# Patient Record
Sex: Female | Born: 1945 | Race: Asian | Hispanic: No | Marital: Single | Smoking: Never smoker
Health system: Southern US, Community
[De-identification: ages and names within clinical notes are randomized; demographics above are authoritative.]

## PROBLEM LIST (undated history)

## (undated) DIAGNOSIS — K219 Gastro-esophageal reflux disease without esophagitis: Secondary | ICD-10-CM

## (undated) DIAGNOSIS — F259 Schizoaffective disorder, unspecified: Secondary | ICD-10-CM

## (undated) DIAGNOSIS — F319 Bipolar disorder, unspecified: Secondary | ICD-10-CM

---

## 1999-02-11 ENCOUNTER — Ambulatory Visit (HOSPITAL_COMMUNITY): Admission: RE | Admit: 1999-02-11 | Discharge: 1999-02-11 | Payer: Self-pay | Admitting: Psychiatry

## 1999-06-09 ENCOUNTER — Emergency Department (HOSPITAL_COMMUNITY): Admission: EM | Admit: 1999-06-09 | Discharge: 1999-06-09 | Payer: Self-pay | Admitting: Emergency Medicine

## 1999-09-23 ENCOUNTER — Encounter: Payer: Self-pay | Admitting: Emergency Medicine

## 1999-09-23 ENCOUNTER — Encounter: Admission: RE | Admit: 1999-09-23 | Discharge: 1999-09-23 | Payer: Self-pay | Admitting: Emergency Medicine

## 2000-11-28 ENCOUNTER — Inpatient Hospital Stay (HOSPITAL_COMMUNITY): Admission: EM | Admit: 2000-11-28 | Discharge: 2000-12-10 | Payer: Self-pay | Admitting: Psychiatry

## 2000-11-30 ENCOUNTER — Encounter: Payer: Self-pay | Admitting: Psychiatry

## 2002-09-11 ENCOUNTER — Encounter: Admission: RE | Admit: 2002-09-11 | Discharge: 2002-09-11 | Payer: Self-pay | Admitting: Emergency Medicine

## 2002-09-11 ENCOUNTER — Encounter: Payer: Self-pay | Admitting: Emergency Medicine

## 2006-02-06 ENCOUNTER — Inpatient Hospital Stay (HOSPITAL_COMMUNITY): Admission: EM | Admit: 2006-02-06 | Discharge: 2006-02-27 | Payer: Self-pay | Admitting: Psychiatry

## 2006-02-06 ENCOUNTER — Ambulatory Visit: Payer: Self-pay | Admitting: Psychiatry

## 2011-02-03 ENCOUNTER — Emergency Department (HOSPITAL_COMMUNITY)
Admission: EM | Admit: 2011-02-03 | Discharge: 2011-02-03 | Disposition: A | Discharge status: Home / Self Care | Payer: Medicare Other | Attending: Emergency Medicine | Admitting: Emergency Medicine

## 2011-02-03 DIAGNOSIS — M25519 Pain in unspecified shoulder: Secondary | ICD-10-CM | POA: Insufficient documentation

## 2011-02-03 DIAGNOSIS — Y9210 Unspecified residential institution as the place of occurrence of the external cause: Secondary | ICD-10-CM | POA: Insufficient documentation

## 2011-02-03 DIAGNOSIS — S42009A Fracture of unspecified part of unspecified clavicle, initial encounter for closed fracture: Secondary | ICD-10-CM | POA: Insufficient documentation

## 2011-02-03 DIAGNOSIS — F209 Schizophrenia, unspecified: Secondary | ICD-10-CM | POA: Insufficient documentation

## 2011-02-03 DIAGNOSIS — W010XXA Fall on same level from slipping, tripping and stumbling without subsequent striking against object, initial encounter: Secondary | ICD-10-CM | POA: Insufficient documentation

## 2013-03-26 ENCOUNTER — Emergency Department (HOSPITAL_COMMUNITY)
Admission: EM | Admit: 2013-03-26 | Discharge: 2013-03-26 | Disposition: A | Discharge status: Home / Self Care | Payer: Medicare Other | Attending: Emergency Medicine | Admitting: Emergency Medicine

## 2013-03-26 ENCOUNTER — Encounter (HOSPITAL_COMMUNITY): Payer: Self-pay | Admitting: Emergency Medicine

## 2013-03-26 DIAGNOSIS — M25561 Pain in right knee: Secondary | ICD-10-CM

## 2013-03-26 DIAGNOSIS — F319 Bipolar disorder, unspecified: Secondary | ICD-10-CM | POA: Insufficient documentation

## 2013-03-26 DIAGNOSIS — M25569 Pain in unspecified knee: Secondary | ICD-10-CM | POA: Insufficient documentation

## 2013-03-26 DIAGNOSIS — K219 Gastro-esophageal reflux disease without esophagitis: Secondary | ICD-10-CM | POA: Insufficient documentation

## 2013-03-26 DIAGNOSIS — F259 Schizoaffective disorder, unspecified: Secondary | ICD-10-CM | POA: Insufficient documentation

## 2013-03-26 HISTORY — DX: Bipolar disorder, unspecified: F31.9

## 2013-03-26 HISTORY — DX: Gastro-esophageal reflux disease without esophagitis: K21.9

## 2013-03-26 HISTORY — DX: Schizoaffective disorder, unspecified: F25.9

## 2013-03-26 MED ORDER — MELOXICAM 7.5 MG PO TABS: 7.5000 mg | ORAL_TABLET | Freq: Every day | ORAL | Status: AC

## 2013-03-26 MED ORDER — IBUPROFEN 200 MG PO TABS
400.0000 mg | ORAL_TABLET | Freq: Once | ORAL | Status: AC
  Administered 2013-03-26: 400 mg via ORAL
  Filled 2013-03-26: qty 2

## 2013-03-26 NOTE — ED Provider Notes (Signed)
CSN: [id number]     Arrival date & time 03/26/13  2139 History   This chart was scribed for non-physician practitioner Kyung Bacca, PA-C working with Celene Kras, MD by Joaquin Music, ED Scribe. This patient was seen in room WTR6/WTR6 and the patient's care was started at 9:55 PM .  Chief Complaint  Patient presents with  . Knee Pain    The history is provided by the patient. No language interpreter was used.   HPI Comments: April Lopez is a 67 y.o. female who presents to the Emergency Department complaining of constant, gradually worsening R knee pain with associated swelling x 2 days.  Aggravated by ambulation. Has not taken anything for pain. No associated fever, skin changes, RLE weakness/paresthesias.  Pt states she has never had pain in the R knee but states she has had similar pain in the L knee.  Pt denies recent falls or traumas.  Immunocompetent.     Past Medical History  Diagnosis Date  . Schizoaffective disorder   . Bipolar disorder   . GERD (gastroesophageal reflux disease)    No past surgical history on file. No family history on file. History  Substance Use Topics  . Smoking status: Not on file  . Smokeless tobacco: Not on file  . Alcohol Use: Not on file   OB History   Grav Para Term Preterm Abortions TAB SAB Ect Mult Living                 Review of Systems  Musculoskeletal:       R Knee Pain   All other systems reviewed and are negative.    Allergies  Review of patient's allergies indicates no known allergies.  Home Medications   Current Outpatient Rx  Name  Route  Sig  Dispense  Refill  . aspirin 500 MG EC tablet   Oral   Take 1,000 mg by mouth every 6 (six) hours as needed for pain.         . benztropine (COGENTIN) 0.5 MG tablet   Oral   Take 0.5 mg by mouth 2 (two) times daily.         Marland Kitchen dextromethorphan-guaiFENesin (ROBITUSSIN-DM) 10-100 MG/5ML liquid   Oral   Take 5 mLs by mouth every 4 (four) hours as needed for  cough.         Marland Kitchen LORazepam (ATIVAN) 0.5 MG tablet   Oral   Take 0.5 mg by mouth at bedtime.         Marland Kitchen omeprazole (PRILOSEC) 20 MG capsule   Oral   Take 20 mg by mouth at bedtime.         . risperiDONE (RISPERDAL) 0.5 MG tablet   Oral   Take 0.5 mg by mouth 2 (two) times daily.         . risperiDONE (RISPERDAL) 2 MG tablet   Oral   Take 2 mg by mouth at bedtime.          Triage Vitals:BP 125/72  Pulse 76  Temp(Src) 98 F (36.7 C) (Oral)  Resp 16  SpO2 98%  Physical Exam  Nursing note and vitals reviewed. Constitutional: She is oriented to person, place, and time. She appears well-developed and well-nourished. No distress.  HENT:  Head: Normocephalic and atraumatic.  Eyes:  Normal appearance  Neck: Normal range of motion.  Pulmonary/Chest: Effort normal.  Musculoskeletal: Normal range of motion.  Right knee w/out deformity, erythema or other skin changes.  Trace edema medial  to patella compared to L knee.  No knee tenderness.  Pain w/ passive flexion/extension.  No edema/tenderness of calf.  2+ DP pulse and distal sensation intact.    Neurological: She is alert and oriented to person, place, and time.  Psychiatric: She has a normal mood and affect. Her behavior is normal.    ED Course  Procedures  Labs Review Labs Reviewed - No data to display Imaging Review No results found.  EKG Interpretation   None       MDM   1. Pain in right knee    [phone] F presents w/ non-traumatic R knee pain.  No signs of septic arthritis on exam.  History/exam most consistent w/ OA. Ortho tech provided pt w/ knee sleeve, I prescribed mobic, and recommended RICE.  Referred to ortho for persistent/worsening sx.  Return precautions discussed.   I personally performed the services described in this documentation, which was scribed in my presence. The recorded information has been reviewed and is accurate. s   Otilio Miu, PA-C 03/26/13 2352

## 2013-03-26 NOTE — ED Notes (Signed)
[facility] here to take pt back to [facility].

## 2013-03-26 NOTE — ED Notes (Signed)
Pt BIB EMS. Pt is from [facility].  Pt c/o R knee pain. No injury to knee. Pt states knee has been hurting for the past 2 days. Pt normally walks, but now walks with limp. Pt was not given any pain meds. Pt alert to baseline.

## 2013-03-26 NOTE — ED Notes (Signed)
[facility] called to take pt back to [facility].

## 2013-03-31 NOTE — ED Provider Notes (Signed)
Medical screening examination/treatment/procedure(s) were performed by non-physician practitioner and as supervising physician I was immediately available for consultation/collaboration.   Celene Kras, MD 03/31/13 [id number]

## 2013-08-21 ENCOUNTER — Emergency Department (HOSPITAL_COMMUNITY)
Admission: EM | Admit: 2013-08-21 | Discharge: 2013-08-21 | Disposition: A | Discharge status: Home / Self Care | Payer: Medicare Other | Attending: Emergency Medicine | Admitting: Emergency Medicine

## 2013-08-21 ENCOUNTER — Emergency Department (HOSPITAL_COMMUNITY): Payer: Medicare Other

## 2013-08-21 ENCOUNTER — Encounter (HOSPITAL_COMMUNITY): Payer: Self-pay | Admitting: Emergency Medicine

## 2013-08-21 DIAGNOSIS — R05 Cough: Secondary | ICD-10-CM

## 2013-08-21 DIAGNOSIS — K219 Gastro-esophageal reflux disease without esophagitis: Secondary | ICD-10-CM | POA: Insufficient documentation

## 2013-08-21 DIAGNOSIS — Z791 Long term (current) use of non-steroidal anti-inflammatories (NSAID): Secondary | ICD-10-CM | POA: Insufficient documentation

## 2013-08-21 DIAGNOSIS — Z79899 Other long term (current) drug therapy: Secondary | ICD-10-CM | POA: Insufficient documentation

## 2013-08-21 DIAGNOSIS — F259 Schizoaffective disorder, unspecified: Secondary | ICD-10-CM | POA: Insufficient documentation

## 2013-08-21 DIAGNOSIS — J069 Acute upper respiratory infection, unspecified: Secondary | ICD-10-CM | POA: Insufficient documentation

## 2013-08-21 DIAGNOSIS — R059 Cough, unspecified: Secondary | ICD-10-CM | POA: Insufficient documentation

## 2013-08-21 DIAGNOSIS — R0789 Other chest pain: Secondary | ICD-10-CM | POA: Insufficient documentation

## 2013-08-21 DIAGNOSIS — Z880 Allergy status to penicillin: Secondary | ICD-10-CM | POA: Insufficient documentation

## 2013-08-21 LAB — CBC WITH DIFFERENTIAL/PLATELET
BASOS ABS: 0 10*3/uL (ref 0.0–0.1)
Basophils Relative: 1 % (ref 0–1)
EOS PCT: 4 % (ref 0–5)
Eosinophils Absolute: 0.1 10*3/uL (ref 0.0–0.7)
HCT: 37.5 % (ref 36.0–46.0)
Hemoglobin: 12.9 g/dL (ref 12.0–15.0)
LYMPHS ABS: 1.3 10*3/uL (ref 0.7–4.0)
LYMPHS PCT: 40 % (ref 12–46)
MCH: 31.6 pg (ref 26.0–34.0)
MCHC: 34.4 g/dL (ref 30.0–36.0)
MCV: 91.9 fL (ref 78.0–100.0)
Monocytes Absolute: 0.4 10*3/uL (ref 0.1–1.0)
Monocytes Relative: 14 % — ABNORMAL HIGH (ref 3–12)
NEUTROS ABS: 1.3 10*3/uL — AB (ref 1.7–7.7)
Neutrophils Relative %: 41 % — ABNORMAL LOW (ref 43–77)
PLATELETS: 129 10*3/uL — AB (ref 150–400)
RBC: 4.08 MIL/uL (ref 3.87–5.11)
RDW: 12.7 % (ref 11.5–15.5)
WBC: 3.2 10*3/uL — AB (ref 4.0–10.5)

## 2013-08-21 LAB — I-STAT TROPONIN, ED: TROPONIN I, POC: 0.01 ng/mL (ref 0.00–0.08)

## 2013-08-21 LAB — I-STAT CHEM 8, ED
BUN: 8 mg/dL (ref 6–23)
CHLORIDE: 103 meq/L (ref 96–112)
Calcium, Ion: 1.24 mmol/L (ref 1.13–1.30)
Creatinine, Ser: 0.7 mg/dL (ref 0.50–1.10)
GLUCOSE: 91 mg/dL (ref 70–99)
HEMATOCRIT: 39 % (ref 36.0–46.0)
HEMOGLOBIN: 13.3 g/dL (ref 12.0–15.0)
POTASSIUM: 3.5 meq/L — AB (ref 3.7–5.3)
SODIUM: 141 meq/L (ref 137–147)
TCO2: 28 mmol/L (ref 0–100)

## 2013-08-21 MED ORDER — DM-GUAIFENESIN ER 30-600 MG PO TB12
1.0000 | ORAL_TABLET | Freq: Two times a day (BID) | ORAL | Status: DC
  Administered 2013-08-21: 1 via ORAL
  Filled 2013-08-21 (×2): qty 1

## 2013-08-21 MED ORDER — DM-GUAIFENESIN ER 30-600 MG PO TB12: 1.0000 | ORAL_TABLET | Freq: Two times a day (BID) | ORAL | Status: AC | PRN

## 2013-08-21 MED ORDER — BENZONATATE 100 MG PO CAPS
200.0000 mg | ORAL_CAPSULE | Freq: Three times a day (TID) | ORAL | Status: DC | PRN
  Administered 2013-08-21: 200 mg via ORAL
  Filled 2013-08-21: qty 2

## 2013-08-21 MED ORDER — BENZONATATE 200 MG PO CAPS: 200.0000 mg | ORAL_CAPSULE | Freq: Three times a day (TID) | ORAL | Status: AC | PRN

## 2013-08-21 NOTE — ED Notes (Signed)
Patient states she had flu x2 weeks and had throat pain and it moved into her central chest. Patient pain woke her up from sleep. Patient received 324 ASA and Nitro x2. Patient now complaining of 3/10 back pain.

## 2013-08-21 NOTE — Discharge Instructions (Signed)
Chest Pain Observation It is often hard to give a specific diagnosis for the cause of chest pain. Among other possibilities your symptoms might be caused by inadequate oxygen delivery to your heart (angina). Angina that is not treated or evaluated can lead to a heart attack (myocardial infarction) or death. Blood tests, electrocardiograms, and X-rays may have been done to help determine a possible cause of your chest pain. After evaluation and observation, your health care provider has determined that it is unlikely your pain was caused by an unstable condition that requires hospitalization. However, a full evaluation of your pain may need to be completed, with additional diagnostic testing as directed. It is very important to keep your follow-up appointments. Not keeping your follow-up appointments could result in permanent heart damage, disability, or death. If there is any problem keeping your follow-up appointments, you must call your health care provider. HOME CARE INSTRUCTIONS  Due to the slight chance that your pain could be angina, it is important to follow your health care provider's treatment plan and also maintain a healthy lifestyle:  Maintain or work toward achieving a healthy weight.  Stay physically active and exercise regularly.  Decrease your salt intake.  Eat a balanced, healthy diet. Talk to a dietician to learn about heart healthy foods.  Increase your fiber intake by including whole grains, vegetables, fruits, and nuts in your diet.  Avoid situations that cause stress, anger, or depression.  Take medicines as advised by your health care provider. Report any side effects to your health care provider. Do not stop medicines or adjust the dosages on your own.  Quit smoking. Do not use nicotine patches or gum until you check with your health care provider.  Keep your blood pressure, blood sugar, and cholesterol levels within normal limits.  Limit alcohol intake to no more than  1 drink per day for women that are not pregnant and 2 drinks per day for men.  Do not abuse drugs. SEEK IMMEDIATE MEDICAL CARE IF: You have severe chest pain or pressure which may include symptoms such as:  You feel pain or pressure in you arms, neck, jaw, or back.  You have severe back or abdominal pain, feel sick to your stomach (nauseous), or throw up (vomit).  You are sweating profusely.  You are having a fast or irregular heartbeat.  You feel short of breath while at rest.  You notice increasing shortness of breath during rest, sleep, or with activity.  You have chest pain that does not get better after rest or after taking your usual medicine.  You wake from sleep with chest pain.  You are unable to sleep because you cannot breathe.  You develop a frequent cough or you are coughing up blood.  You feel dizzy, faint, or experience extreme fatigue.  You develop severe weakness, dizziness, fainting, or chills. Any of these symptoms may represent a serious problem that is an emergency. Do not wait to see if the symptoms will go away. Call your local emergency services (911 in the [place] Do not drive yourself to the hospital. MAKE SURE YOU:  Understand these instructions.  Will watch your condition.  Will get help right away if you are not doing well or get worse. Document Released: 06/24/2010 Document Revised: 01/22/2013 Document Reviewed: 11/21/2012 [facility] Patient Information 2014 [place], [place].  Cough, Adult  A cough is a reflex that helps clear your throat and airways. It can help heal the body or may be a reaction to an  irritated airway. A cough may only last 2 or 3 weeks (acute) or may last more than 8 weeks (chronic).  CAUSES Acute cough:  Viral or bacterial infections. Chronic cough:  Infections.  Allergies.  Asthma.  Post-nasal drip.  Smoking.  Heartburn or acid reflux.  Some medicines.  Chronic lung problems (COPD).  Cancer. SYMPTOMS    Cough.  Fever.  Chest pain.  Increased breathing rate.  High-pitched whistling sound when breathing (wheezing).  Colored mucus that you cough up (sputum). TREATMENT   A bacterial cough may be treated with antibiotic medicine.  A viral cough must run its course and will not respond to antibiotics.  Your caregiver may recommend other treatments if you have a chronic cough. HOME CARE INSTRUCTIONS   Only take over-the-counter or prescription medicines for pain, discomfort, or fever as directed by your caregiver. Use cough suppressants only as directed by your caregiver.  Use a cold steam vaporizer or humidifier in your bedroom or home to help loosen secretions.  Sleep in a semi-upright position if your cough is worse at night.  Rest as needed.  Stop smoking if you smoke. SEEK IMMEDIATE MEDICAL CARE IF:   You have pus in your sputum.  Your cough starts to worsen.  You cannot control your cough with suppressants and are losing sleep.  You begin coughing up blood.  You have difficulty breathing.  You develop pain which is getting worse or is uncontrolled with medicine.  You have a fever. MAKE SURE YOU:   Understand these instructions.  Will watch your condition.  Will get help right away if you are not doing well or get worse. Document Released: 11/18/2010 Document Revised: 08/14/2011 Document Reviewed: 11/18/2010 [facility] Patient Information 2014 [place], [place].  Upper Respiratory Infection, Adult An upper respiratory infection (URI) is also sometimes known as the common cold. The upper respiratory tract includes the nose, sinuses, throat, trachea, and bronchi. Bronchi are the airways leading to the lungs. Most people improve within 1 week, but symptoms can last up to 2 weeks. A residual cough may last even longer.  CAUSES Many different viruses can infect the tissues lining the upper respiratory tract. The tissues become irritated and inflamed and often become  very moist. Mucus production is also common. A cold is contagious. You can easily spread the virus to others by oral contact. This includes kissing, sharing a glass, coughing, or sneezing. Touching your mouth or nose and then touching a surface, which is then touched by another person, can also spread the virus. SYMPTOMS  Symptoms typically develop 1 to 3 days after you come in contact with a cold virus. Symptoms vary from person to person. They may include:  Runny nose.  Sneezing.  Nasal congestion.  Sinus irritation.  Sore throat.  Loss of voice (laryngitis).  Cough.  Fatigue.  Muscle aches.  Loss of appetite.  Headache.  Low-grade fever. DIAGNOSIS  You might diagnose your own cold based on familiar symptoms, since most people get a cold 2 to 3 times a year. Your caregiver can confirm this based on your exam. Most importantly, your caregiver can check that your symptoms are not due to another disease such as strep throat, sinusitis, pneumonia, asthma, or epiglottitis. Blood tests, throat tests, and X-rays are not necessary to diagnose a common cold, but they may sometimes be helpful in excluding other more serious diseases. Your caregiver will decide if any further tests are required. RISKS AND COMPLICATIONS  You may be at risk for a  more severe case of the common cold if you smoke cigarettes, have chronic heart disease (such as heart failure) or lung disease (such as asthma), or if you have a weakened immune system. The very young and very old are also at risk for more serious infections. Bacterial sinusitis, middle ear infections, and bacterial pneumonia can complicate the common cold. The common cold can worsen asthma and chronic obstructive pulmonary disease (COPD). Sometimes, these complications can require emergency medical care and may be life-threatening. PREVENTION  The best way to protect against getting a cold is to practice good hygiene. Avoid oral or hand contact with  people with cold symptoms. Wash your hands often if contact occurs. There is no clear evidence that vitamin C, vitamin E, echinacea, or exercise reduces the chance of developing a cold. However, it is always recommended to get plenty of rest and practice good nutrition. TREATMENT  Treatment is directed at relieving symptoms. There is no cure. Antibiotics are not effective, because the infection is caused by a virus, not by bacteria. Treatment may include:  Increased fluid intake. Sports drinks offer valuable electrolytes, sugars, and fluids.  Breathing heated mist or steam (vaporizer or shower).  Eating chicken soup or other clear broths, and maintaining good nutrition.  Getting plenty of rest.  Using gargles or lozenges for comfort.  Controlling fevers with ibuprofen or acetaminophen as directed by your caregiver.  Increasing usage of your inhaler if you have asthma. Zinc gel and zinc lozenges, taken in the first 24 hours of the common cold, can shorten the duration and lessen the severity of symptoms. Pain medicines may help with fever, muscle aches, and throat pain. A variety of non-prescription medicines are available to treat congestion and runny nose. Your caregiver can make recommendations and may suggest nasal or lung inhalers for other symptoms.  HOME CARE INSTRUCTIONS   Only take over-the-counter or prescription medicines for pain, discomfort, or fever as directed by your caregiver.  Use a warm mist humidifier or inhale steam from a shower to increase air moisture. This may keep secretions moist and make it easier to breathe.  Drink enough water and fluids to keep your urine clear or pale yellow.  Rest as needed.  Return to work when your temperature has returned to normal or as your caregiver advises. You may need to stay home longer to avoid infecting others. You can also use a face mask and careful hand washing to prevent spread of the virus. SEEK MEDICAL CARE IF:   After  the first few days, you feel you are getting worse rather than better.  You need your caregiver's advice about medicines to control symptoms.  You develop chills, worsening shortness of breath, or brown or red sputum. These may be signs of pneumonia.  You develop yellow or brown nasal discharge or pain in the face, especially when you bend forward. These may be signs of sinusitis.  You develop a fever, swollen neck glands, pain with swallowing, or white areas in the back of your throat. These may be signs of strep throat. SEEK IMMEDIATE MEDICAL CARE IF:   You have a fever.  You develop severe or persistent headache, ear pain, sinus pain, or chest pain.  You develop wheezing, a prolonged cough, cough up blood, or have a change in your usual mucus (if you have chronic lung disease).  You develop sore muscles or a stiff neck. Document Released: 11/15/2000 Document Revised: 08/14/2011 Document Reviewed: 09/23/2010 [facility] Patient Information 2014 [place].

## 2013-08-21 NOTE — ED Provider Notes (Signed)
CSN: [id number]     Arrival date & time 08/21/13  0342 History   First MD Initiated Contact with Patient 08/21/13 [phone]     Chief Complaint  Patient presents with  . Code STEMI     (Consider location/radiation/quality/duration/timing/severity/associated sxs/prior Treatment) HPI 68 year old female presents to emergency department from her nursing facility with complaint of chest pain.  Patient was initially called out as a code STEMI, do to some ST elevations in V2, and 3.  Patient, reports she's had 2 weeks of the flu.  Initially started with your pain, which moved into her chest.  Pain tonight was worse than it had been.  It woke her from sleep.  After aspirin, patient reports pain is improved.  She reports pain in her chest is mainly in her upper back.  She, reports she's had some cough.  She has had subjective fevers and chills.  After evaluation by cardiology, code STEMI, was discontinued Past Medical History  Diagnosis Date  . Schizoaffective disorder   . Bipolar disorder   . GERD (gastroesophageal reflux disease)    History reviewed. No pertinent past surgical history. No family history on file. History  Substance Use Topics  . Smoking status: Never Smoker   . Smokeless tobacco: Never Used  . Alcohol Use: No   OB History   Grav Para Term Preterm Abortions TAB SAB Ect Mult Living                 Review of Systems  Unable to perform ROS: Psychiatric disorder      Allergies  Penicillins  Home Medications   Current Outpatient Rx  Name  Route  Sig  Dispense  Refill  . aspirin 500 MG EC tablet   Oral   Take 1,000 mg by mouth every 6 (six) hours as needed for pain.         . benztropine (COGENTIN) 0.5 MG tablet   Oral   Take 0.5 mg by mouth 2 (two) times daily.         Marland Kitchen dextromethorphan-guaiFENesin (ROBITUSSIN-DM) 10-100 MG/5ML liquid   Oral   Take 5 mLs by mouth every 4 (four) hours as needed for cough.         Marland Kitchen LORazepam (ATIVAN) 0.5 MG tablet   Oral   Take 0.5 mg by mouth at bedtime.         . meloxicam (MOBIC) 7.5 MG tablet   Oral   Take 1 tablet (7.5 mg total) by mouth daily.   30 tablet   0   . omeprazole (PRILOSEC) 20 MG capsule   Oral   Take 20 mg by mouth at bedtime.         . risperiDONE (RISPERDAL) 0.5 MG tablet   Oral   Take 0.5 mg by mouth 2 (two) times daily.         . risperiDONE (RISPERDAL) 2 MG tablet   Oral   Take 2 mg by mouth at bedtime.          BP 98/62  Pulse 63  Temp(Src) 97.3 F (36.3 C) (Oral)  Resp 18  SpO2 98% Physical Exam  Nursing note and vitals reviewed. Constitutional: She is oriented to person, place, and time. She appears well-developed and well-nourished. No distress.  HENT:  Head: Normocephalic and atraumatic.  Right Ear: External ear normal.  Left Ear: External ear normal.  Nose: Nose normal.  Mouth/Throat: Oropharynx is clear and moist.  Eyes: Conjunctivae and EOM are normal. Pupils are  equal, round, and reactive to light.  Neck: Normal range of motion. Neck supple. No JVD present. No tracheal deviation present. No thyromegaly present.  Cardiovascular: Normal rate, regular rhythm, normal heart sounds and intact distal pulses.  Exam reveals no gallop and no friction rub.   No murmur heard. Pulmonary/Chest: Effort normal and breath sounds normal. No stridor. No respiratory distress. She has no wheezes. She has no rales. She exhibits no tenderness.  Abdominal: Soft. Bowel sounds are normal. She exhibits no distension and no mass. There is no tenderness. There is no rebound and no guarding.  Musculoskeletal: Normal range of motion. She exhibits no edema and no tenderness.  Lymphadenopathy:    She has no cervical adenopathy.  Neurological: She is alert and oriented to person, place, and time. She has normal reflexes. No cranial nerve deficit. She exhibits normal muscle tone. Coordination normal.  Skin: Skin is warm and dry. No rash noted. No erythema. No pallor.  Psychiatric:  She has a normal mood and affect. Her behavior is normal. Judgment and thought content normal.    ED Course  Procedures (including critical care time) Labs Review Labs Reviewed  CBC WITH DIFFERENTIAL - Abnormal; Notable for the following:    WBC 3.2 (*)    Platelets 129 (*)    Neutrophils Relative % 41 (*)    Neutro Abs 1.3 (*)    Monocytes Relative 14 (*)    All other components within normal limits  I-STAT CHEM 8, ED - Abnormal; Notable for the following:    Potassium 3.5 (*)    All other components within normal limits  I-STAT TROPOININ, ED   Imaging Review Dg Chest 2 View  08/21/2013   CLINICAL DATA:  Chest pain  EXAM: CHEST  2 VIEW  COMPARISON:  None available  FINDINGS: The cardiac and mediastinal silhouettes are within normal limits.  The lungs are normally inflated. No airspace consolidation, pleural effusion, or pulmonary edema is identified. There is no pneumothorax.  No acute osseous abnormality identified.  IMPRESSION: No radiographic evidence of acute cardiopulmonary abnormality.   Electronically Signed   By: Rise MuBenjamin  McClintock M.D.   On: 08/21/2013 04:43     EKG Interpretation None      Date: 08/21/2013  Rate: 61  Rhythm: normal sinus rhythm  QRS Axis: right  Intervals: normal  ST/T Wave abnormalities: normal  Conduction Disutrbances:none  Narrative Interpretation:   Old EKG Reviewed: none available    MDM   Final diagnoses:  Cough  Atypical chest pain  URI (upper respiratory infection)    68 year old female who reports flulike symptoms for 2 weeks.  Not a code STEMI.  Plan to get chest x-ray, and labs and reassess    Olivia Mackielga M Jawuan Robb, MD 08/21/13 [phone]

## 2013-08-21 NOTE — ED Notes (Signed)
Dr. Harwani at bedside. 

## 2013-08-21 NOTE — ED Notes (Signed)
[facility] leaving with [facility]

## 2015-07-13 ENCOUNTER — Other Ambulatory Visit: Payer: Self-pay | Admitting: Family Medicine

## 2015-07-13 DIAGNOSIS — Z1231 Encounter for screening mammogram for malignant neoplasm of breast: Secondary | ICD-10-CM

## 2015-07-27 ENCOUNTER — Ambulatory Visit: Payer: Medicare Other

## 2015-07-27 ENCOUNTER — Ambulatory Visit
Admission: RE | Admit: 2015-07-27 | Discharge: 2015-07-27 | Disposition: A | Discharge status: Home / Self Care | Payer: Medicare Other | Source: Ambulatory Visit | Attending: Family Medicine | Admitting: Family Medicine

## 2015-07-27 DIAGNOSIS — Z1231 Encounter for screening mammogram for malignant neoplasm of breast: Secondary | ICD-10-CM

## 2015-07-30 ENCOUNTER — Other Ambulatory Visit: Payer: Self-pay | Admitting: Family Medicine

## 2015-07-30 DIAGNOSIS — R928 Other abnormal and inconclusive findings on diagnostic imaging of breast: Secondary | ICD-10-CM

## 2015-08-05 ENCOUNTER — Ambulatory Visit
Admission: RE | Admit: 2015-08-05 | Discharge: 2015-08-05 | Disposition: A | Discharge status: Home / Self Care | Payer: Medicare Other | Source: Ambulatory Visit | Attending: Family Medicine | Admitting: Family Medicine

## 2015-08-05 DIAGNOSIS — R928 Other abnormal and inconclusive findings on diagnostic imaging of breast: Secondary | ICD-10-CM

## 2016-07-24 ENCOUNTER — Emergency Department (HOSPITAL_COMMUNITY): Payer: Medicare Other

## 2016-07-24 ENCOUNTER — Encounter (HOSPITAL_COMMUNITY): Payer: Self-pay

## 2016-07-24 ENCOUNTER — Emergency Department (HOSPITAL_COMMUNITY)
Admission: EM | Admit: 2016-07-24 | Discharge: 2016-07-24 | Disposition: A | Discharge status: Home / Self Care | Payer: Medicare Other | Attending: Emergency Medicine | Admitting: Emergency Medicine

## 2016-07-24 DIAGNOSIS — Z79899 Other long term (current) drug therapy: Secondary | ICD-10-CM | POA: Diagnosis not present

## 2016-07-24 DIAGNOSIS — Y929 Unspecified place or not applicable: Secondary | ICD-10-CM | POA: Diagnosis not present

## 2016-07-24 DIAGNOSIS — W010XXA Fall on same level from slipping, tripping and stumbling without subsequent striking against object, initial encounter: Secondary | ICD-10-CM | POA: Diagnosis not present

## 2016-07-24 DIAGNOSIS — M25562 Pain in left knee: Secondary | ICD-10-CM

## 2016-07-24 DIAGNOSIS — Y939 Activity, unspecified: Secondary | ICD-10-CM | POA: Insufficient documentation

## 2016-07-24 DIAGNOSIS — Y999 Unspecified external cause status: Secondary | ICD-10-CM | POA: Insufficient documentation

## 2016-07-24 DIAGNOSIS — S8002XA Contusion of left knee, initial encounter: Secondary | ICD-10-CM | POA: Insufficient documentation

## 2016-07-24 DIAGNOSIS — M25552 Pain in left hip: Secondary | ICD-10-CM | POA: Diagnosis not present

## 2016-07-24 DIAGNOSIS — M25561 Pain in right knee: Secondary | ICD-10-CM

## 2016-07-24 DIAGNOSIS — S8001XA Contusion of right knee, initial encounter: Secondary | ICD-10-CM | POA: Diagnosis not present

## 2016-07-24 DIAGNOSIS — Z7982 Long term (current) use of aspirin: Secondary | ICD-10-CM | POA: Diagnosis not present

## 2016-07-24 DIAGNOSIS — W19XXXA Unspecified fall, initial encounter: Secondary | ICD-10-CM

## 2016-07-24 MED ORDER — ACETAMINOPHEN 325 MG PO TABS
650.0000 mg | ORAL_TABLET | Freq: Once | ORAL | Status: AC
  Administered 2016-07-24: 650 mg via ORAL
  Filled 2016-07-24: qty 2

## 2016-07-24 NOTE — ED Triage Notes (Signed)
Pt tripped today and landed on her knees. Both knees appear bruised, and R knee is swollen. Pt has full ROM of both knees and was able to stand in triage. No LOC, No Blood Thinners. A&Ox4.

## 2016-07-24 NOTE — ED Provider Notes (Signed)
[facility]-EMERGENCY DEPT Provider Note   CSN: [id number] Arrival date & time: 07/24/16  1702  By signing my name below, I, Sonum Patel, attest that this documentation has been prepared under the direction and in the presence of [employer], PA-C. Electronically Signed: Sonum Patel, Neurosurgeon. 07/24/16. 7:55 PM.  History   Chief Complaint Chief Complaint  Patient presents with  . Fall  . Knee Pain    R    The history is provided by the patient. No language interpreter was used.     HPI Comments:  April Lopez is a 71 y.o. female who presents to the Emergency Department complaining of a fall that occurred today. Patient states she tripped and fell, landing on both knees. She currently has bilateral knee pain, right significantly worse than left. She has associated swelling to the right knee and bruising to the left knee. She states the pain is worse with ambulation. She denies head injury, LOC, or any other body injuries at this time. She denies taking any OTC pain medications. She denies anti-coagulant use.   Past Medical History:  Diagnosis Date  . Bipolar disorder (HCC)   . GERD (gastroesophageal reflux disease)   . Schizoaffective disorder (HCC)     There are no active problems to display for this patient.   History reviewed. No pertinent surgical history.  OB History    No data available       Home Medications    Prior to Admission medications   Medication Sig Start Date End Date Taking? Authorizing Provider  aspirin 500 MG EC tablet Take 1,000 mg by mouth every 6 (six) hours as needed for pain.    Historical Provider, MD  benzonatate (TESSALON) 200 MG capsule Take 1 capsule (200 mg total) by mouth 3 (three) times daily as needed for cough. 08/21/13   Marisa Severin, MD  benztropine (COGENTIN) 0.5 MG tablet Take 0.5 mg by mouth 2 (two) times daily.    Historical Provider, MD  dextromethorphan-guaiFENesin (MUCINEX DM) 30-600 MG per 12 hr tablet Take 1 tablet by mouth 2 (two)  times daily as needed for cough (congestion). 08/21/13   Marisa Severin, MD  dextromethorphan-guaiFENesin (ROBITUSSIN-DM) 10-100 MG/5ML liquid Take 5 mLs by mouth every 4 (four) hours as needed for cough.    Historical Provider, MD  LORazepam (ATIVAN) 0.5 MG tablet Take 0.5 mg by mouth at bedtime.    Historical Provider, MD  meloxicam (MOBIC) 7.5 MG tablet Take 1 tablet (7.5 mg total) by mouth daily. 03/26/13   Santina Evans Schinlever, PA-C  omeprazole (PRILOSEC) 20 MG capsule Take 20 mg by mouth at bedtime.    Historical Provider, MD  risperiDONE (RISPERDAL) 0.5 MG tablet Take 0.5 mg by mouth 2 (two) times daily.    Historical Provider, MD  risperiDONE (RISPERDAL) 2 MG tablet Take 2 mg by mouth at bedtime.    Historical Provider, MD    Family History History reviewed. No pertinent family history.  Social History Social History  Substance Use Topics  . Smoking status: Never Smoker  . Smokeless tobacco: Never Used  . Alcohol use No     Allergies   Penicillins   Review of Systems Review of Systems  Musculoskeletal: Positive for arthralgias and joint swelling.  Skin: Positive for color change (bruising).  Neurological: Positive for headaches. Negative for weakness and numbness.     Physical Exam Updated Vital Signs BP 158/83   Pulse 101   Temp 97.9 F (36.6 C) (Oral)   Resp 16  SpO2 95%   Physical Exam  Constitutional: She is oriented to person, place, and time. She appears well-developed and well-nourished.  HENT:  Head: Normocephalic and atraumatic.  Cardiovascular: Normal rate.   Pulmonary/Chest: Effort normal.  Musculoskeletal: She exhibits tenderness.  Bruising noted to bilateral anterior knees, no open wounds or significant abrasions. Remainder of lower extremity is NTTP with exception of left lateral hip. No obvious deformity.   No trauma/ NTTP of bilateral upper extremities   Neurological: She is alert and oriented to person, place, and time.  Skin: Skin is warm and  dry.  Psychiatric: She has a normal mood and affect.  Nursing note and vitals reviewed.    ED Treatments / Results  DIAGNOSTIC STUDIES: Oxygen Saturation is 95% on RA, adequate by my interpretation.    COORDINATION OF CARE: 7:55 PM Discussed treatment plan with pt at bedside and pt agreed to plan.   Labs (all labs ordered are listed, but only abnormal results are displayed) Labs Reviewed - No data to display  EKG  EKG Interpretation None       Radiology Dg Knee Complete 4 Views Left  Result Date: 07/24/2016 CLINICAL DATA:  Status post fall with pain and swelling both knees. EXAM: LEFT KNEE - COMPLETE 4+ VIEW COMPARISON:  None. FINDINGS: No acute fracture or dislocation is identified. Intramedullary nail traversing a chronic distal femoral diaphyseal fracture and transverse interlocking screws noted. Moderate osteoarthrosis of the knee with narrowing of the medial femorotibial compartment, periarticular osteophytes, and chondrocalcinosis of the lateral femorotibial compartment. No joint effusion. A IMPRESSION: 1. No acute fracture dislocation. 2. Moderate osteoarthrosis of the knee with medial femorotibial compartment joint space narrowing and lateral compartment chondrocalcinosis. Electronically Signed   By: Mitzi HansenLance  Furusawa-Stratton M.D.   On: 07/24/2016 18:07   Dg Knee Complete 4 Views Right  Result Date: 07/24/2016 CLINICAL DATA:  71 y/o F; status post fall with bruising to the knees. EXAM: RIGHT KNEE - COMPLETE 4+ VIEW COMPARISON:  None. FINDINGS: No acute fracture or dislocation identified. Small suprapatellar joint effusion. Moderate osteoarthrosis of the knee with narrowing of the medial femorotibial compartment and sclerosis of articular surfaces, narrowing of the patellofemoral compartment, tricompartmental osteophytosis, and minimal lateral compartment chondrocalcinosis. IMPRESSION: 1. No acute fracture or dislocation identified. 2. Small suprapatellar joint effusion. 3.  Moderate osteoarthrosis of the knee greatest in the medial femorotibial compartment. Electronically Signed   By: Mitzi HansenLance  Furusawa-Stratton M.D.   On: 07/24/2016 18:08    Procedures Procedures (including critical care time)  Medications Ordered in ED Medications  acetaminophen (TYLENOL) tablet 650 mg (650 mg Oral Given 07/24/16 1825)     Initial Impression / Assessment and Plan / ED Course  I have reviewed the triage vital signs and the nursing notes.  Pertinent labs & imaging results that were available during my care of the patient were reviewed by me and considered in my medical decision making (see chart for details).     Final Clinical Impressions(s) / ED Diagnoses   Final diagnoses:  Fall  Acute pain of both knees  Pain of left hip joint    Labs:   Imaging: DG Knee complete R/L  Consults:  Therapeutics: Tylenol   Discharge Meds:   Assessment/Plan: 71 year old female presents status post mechanical fall. Patient has bruising to her anterior knees, no acute fracture noted on her plain films. She is able to stand and ambulate, but has pain to the bilateral knees with this. Patient initially denied any hip pain, with standing  in the ED she reports some minor left lateral hip pain. She is slightly tender to palpation, no obvious deformities. Patient will have plain films of her hips. I have low suspicion for fracture in this patient, she will likely be discharged back to nursing facility with outpatient follow-up. Patient will be given return precautions.   New Prescriptions New Prescriptions   No medications on file   I personally performed the services described in this documentation, which was scribed in my presence. The recorded information has been reviewed and is accurate.   Eyvonne Mechanic, PA-C 07/24/16 1956    Lyndal Pulley, MD 07/25/16 [phone]

## 2016-07-24 NOTE — ED Notes (Signed)
[facility] called back for transport 

## 2016-07-24 NOTE — Discharge Instructions (Signed)
Please read the above information. Please use Tylenol as needed for discomfort. Please follow-up with her primary care provider for reevaluation this week. If you experience any new or worsening signs or symptoms, please return to the emergency room for repeat evaluation.

## 2016-07-24 NOTE — ED Notes (Signed)
[facility] called for transport back to [facility] [place]  

## 2018-08-09 IMAGING — CR DG KNEE COMPLETE 4+V*R*
4 series · 4 of 4 positions shown · non-contrast
Comparison: None.

CLINICAL DATA: 70 y/o F; status post fall with bruising to the
knees.

EXAM:
RIGHT KNEE - COMPLETE 4+ VIEW

[t knee obl right (1 of 2)]
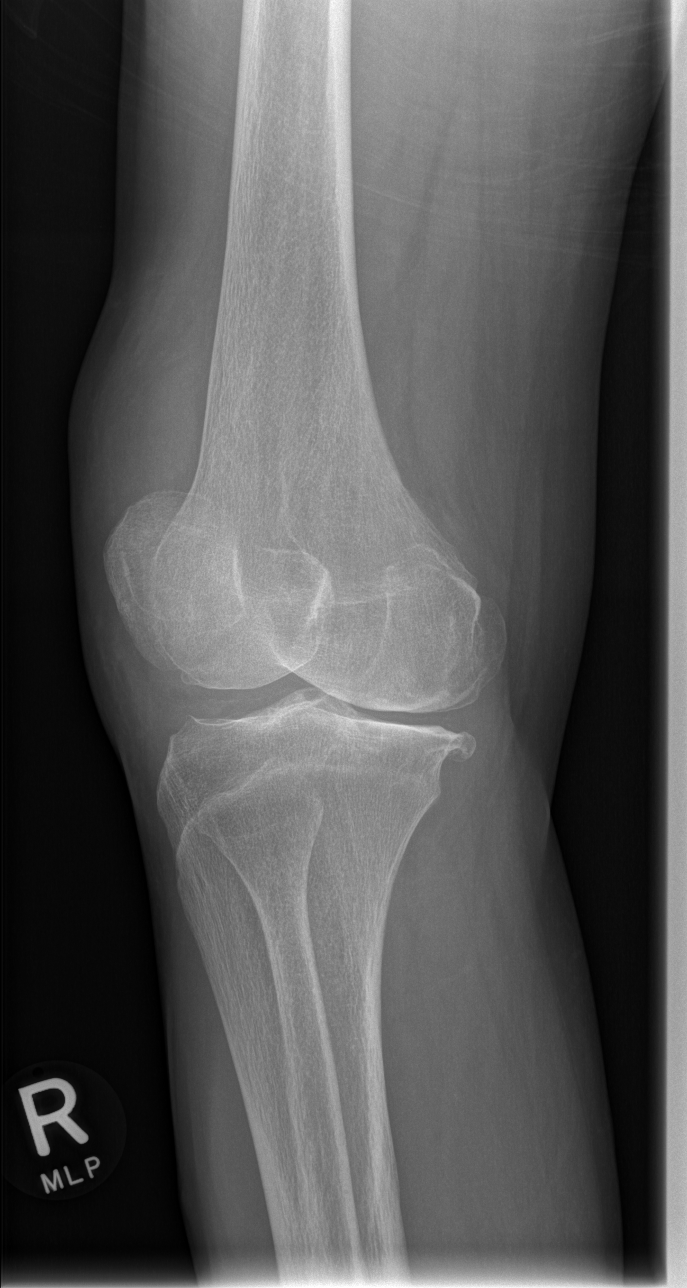

[t knee ap right]
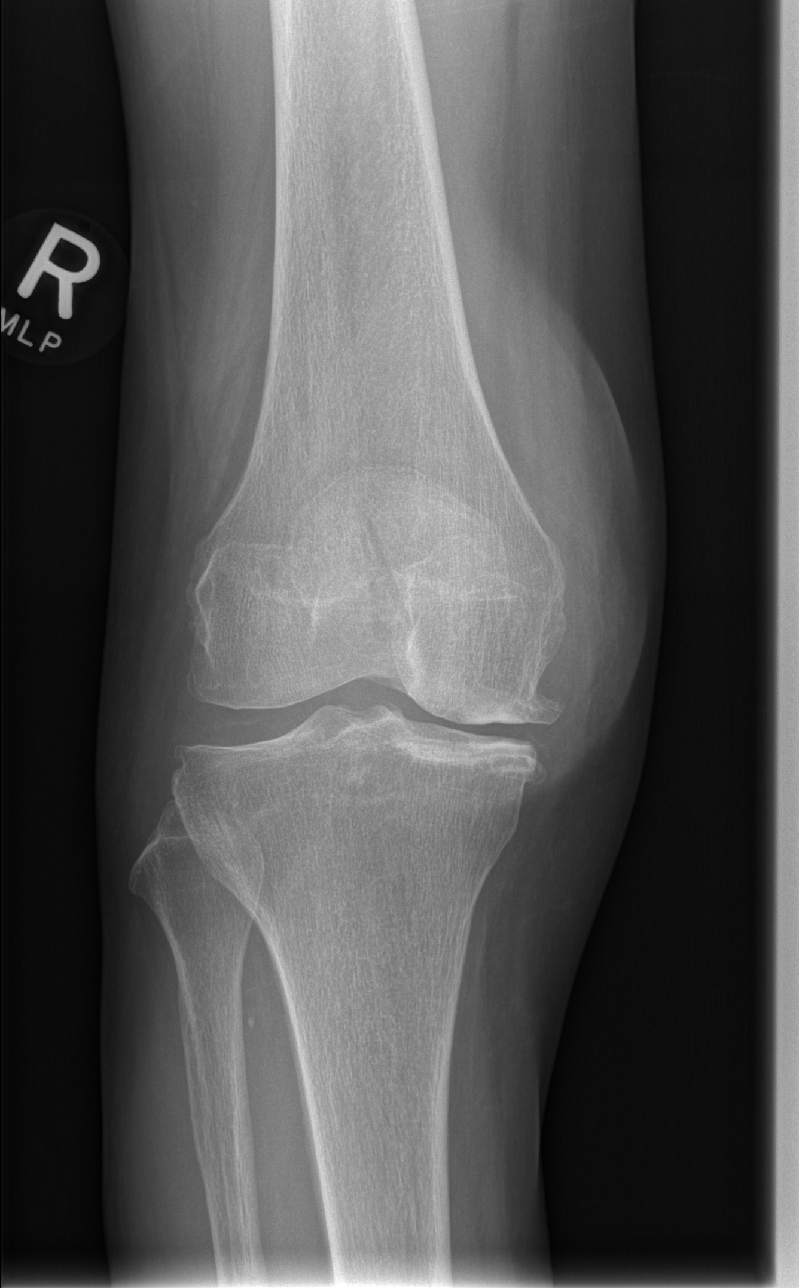

[t knee obl right (2 of 2)]
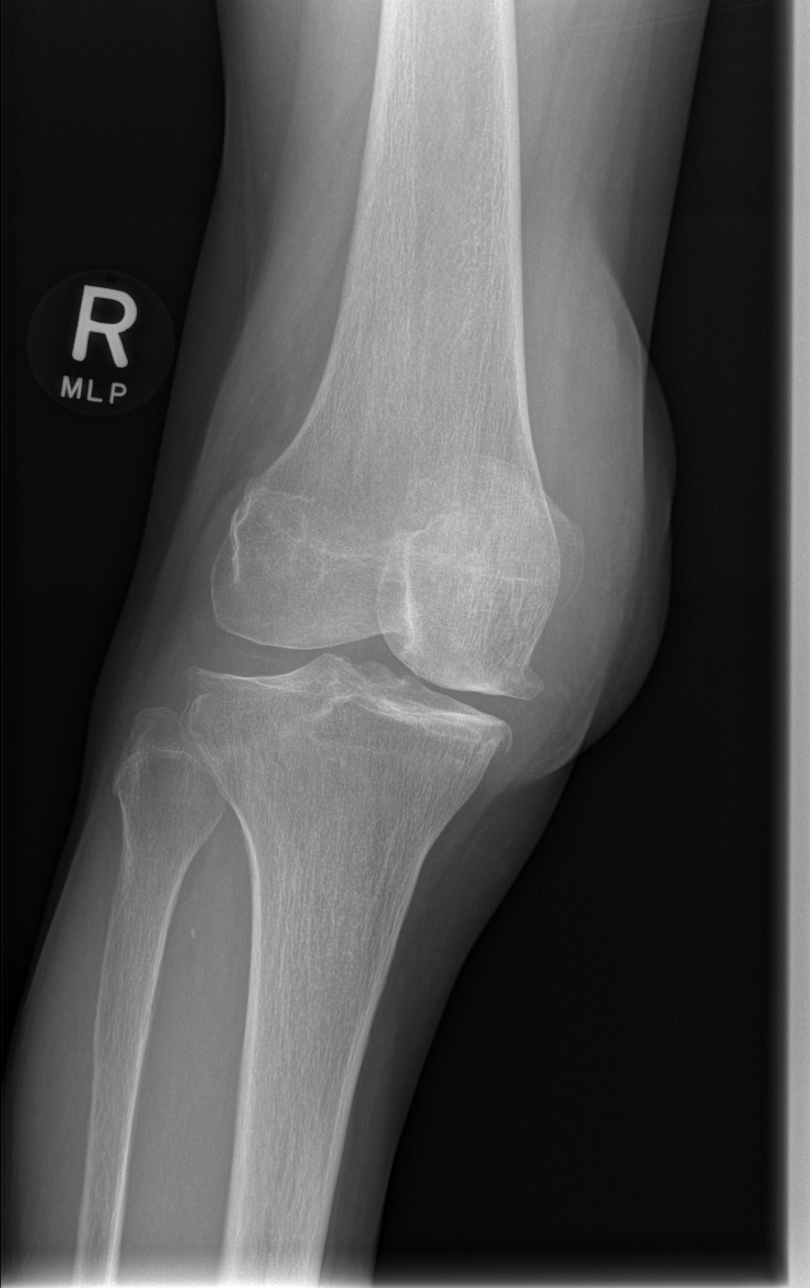

[t knee lat right]
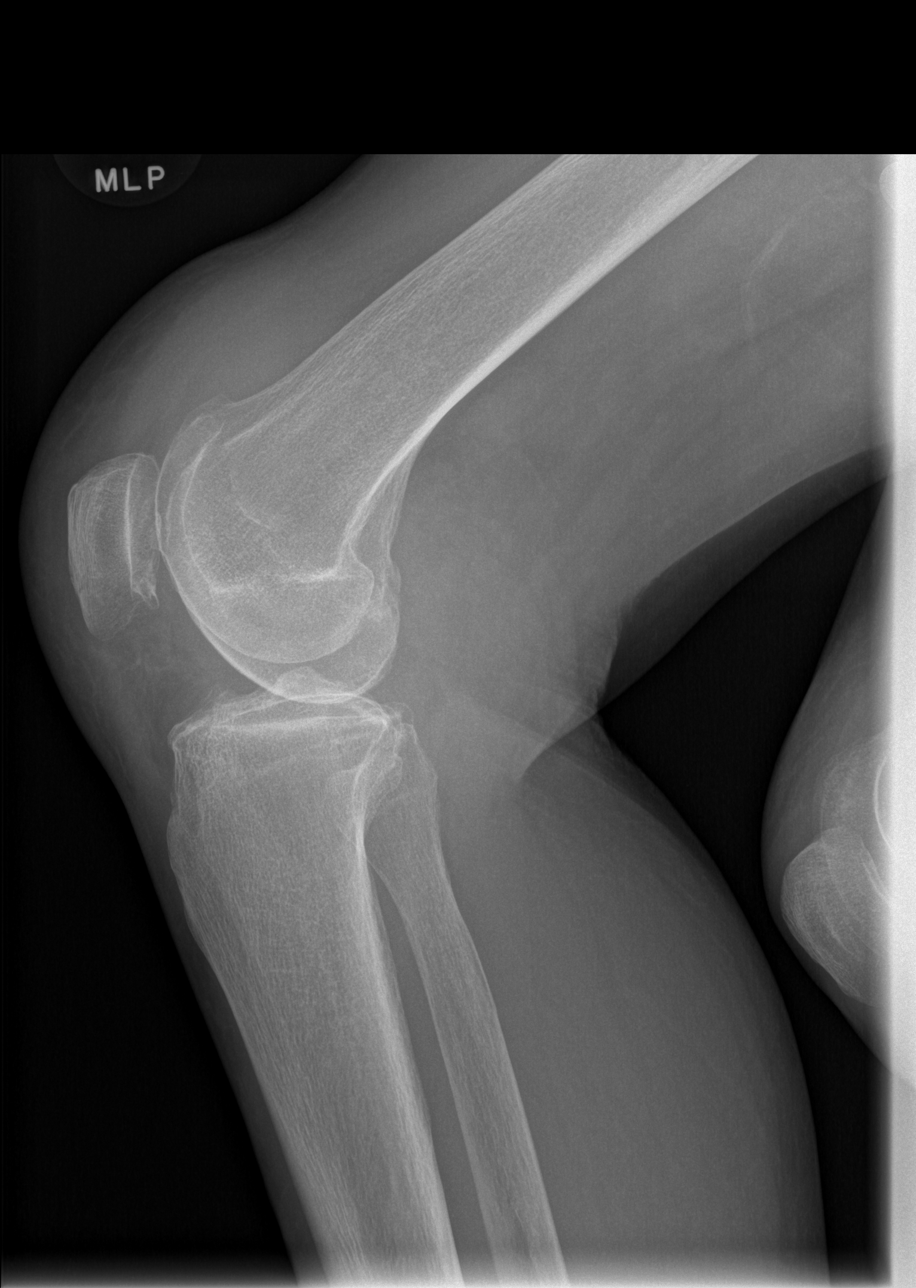

[4 of 4 positions shown; findings below may reference images not displayed]

FINDINGS: No acute fracture or dislocation identified. Small suprapatellar
joint effusion. Moderate osteoarthrosis of the knee with narrowing
of the medial femorotibial compartment and sclerosis of articular
surfaces, narrowing of the patellofemoral compartment,
tricompartmental osteophytosis, and minimal lateral compartment
chondrocalcinosis.
IMPRESSION: 1. No acute fracture or dislocation identified.
2. Small suprapatellar joint effusion.
3. Moderate osteoarthrosis of the knee greatest in the medial
femorotibial compartment.

By: Nya Jumper M.D.

## 2021-08-25 ENCOUNTER — Emergency Department (HOSPITAL_COMMUNITY)
Admission: EM | Admit: 2021-08-25 | Discharge: 2021-08-25 | Disposition: A | Discharge status: Skilled Nursing Facility | Payer: Medicare Other | Attending: Emergency Medicine | Admitting: Emergency Medicine

## 2021-08-25 ENCOUNTER — Other Ambulatory Visit: Payer: Self-pay

## 2021-08-25 ENCOUNTER — Encounter (HOSPITAL_COMMUNITY): Payer: Self-pay | Admitting: *Deleted

## 2021-08-25 DIAGNOSIS — M542 Cervicalgia: Secondary | ICD-10-CM | POA: Diagnosis not present

## 2021-08-25 DIAGNOSIS — Z7982 Long term (current) use of aspirin: Secondary | ICD-10-CM | POA: Insufficient documentation

## 2021-08-25 DIAGNOSIS — Z79899 Other long term (current) drug therapy: Secondary | ICD-10-CM | POA: Insufficient documentation

## 2021-08-25 DIAGNOSIS — R519 Headache, unspecified: Secondary | ICD-10-CM | POA: Diagnosis not present

## 2021-08-25 DIAGNOSIS — W01198A Fall on same level from slipping, tripping and stumbling with subsequent striking against other object, initial encounter: Secondary | ICD-10-CM | POA: Insufficient documentation

## 2021-08-25 DIAGNOSIS — W19XXXA Unspecified fall, initial encounter: Secondary | ICD-10-CM

## 2021-08-25 NOTE — Discharge Instructions (Signed)
Please use Tylenol or ibuprofen for pain.  You may use 600 mg ibuprofen every 6 hours or 1000 mg of Tylenol every 6 hours.  You may choose to alternate between the 2.  This would be most effective.  Not to exceed 4 g of Tylenol within 24 hours.  Not to exceed 3200 mg ibuprofen 24 hours. ? ?Please return if you have any additional concerns. It was a pleasure taking care of you today. ?

## 2021-08-25 NOTE — ED Provider Notes (Signed)
?[facility] DEPT ?Provider Note ? ? ?CSN: [id number] ?Arrival date & time: 08/25/21  1841 ? ?  ? ?History ? ?Chief Complaint  ?Patient presents with  ? Fall  ? ? ?April Lopez is a 76 y.o. female with a past medical history significant for schizoaffective disorder, bipolar, acid reflux, who lives in an assisted care facility who presents from her facility after an unwitnessed fall.  She does not take any blood thinners.  Patient reports that she fell from around the head of her bed, initially had some pain to the right side of her head, and neck but does not have any at this time.  She denies any nausea, vomiting, light sensitivity.  She reports that she did have some pain this morning and some neck pain but she does not have any at this time.  She denies any numbness, tingling she has been able to walk since the incident with no difficulty.  She reports that she did not have any particular concerns where she was sent from her facility. ? ? ?Fall ? ? ?  ? ?Home Medications ?Prior to Admission medications   ?Medication Sig Start Date End Date Taking? Authorizing Provider  ?aspirin 500 MG EC tablet Take 1,000 mg by mouth every 6 (six) hours as needed for pain.    [provider]  ?benzonatate (TESSALON) 200 MG capsule Take 1 capsule (200 mg total) by mouth 3 (three) times daily as needed for cough. 08/21/13   Linton Flemings, MD  ?benztropine (COGENTIN) 0.5 MG tablet Take 0.5 mg by mouth 2 (two) times daily.    [provider]  ?dextromethorphan-guaiFENesin (MUCINEX DM) 30-600 MG per 12 hr tablet Take 1 tablet by mouth 2 (two) times daily as needed for cough (congestion). 08/21/13   Linton Flemings, MD  ?dextromethorphan-guaiFENesin (ROBITUSSIN-DM) 10-100 MG/5ML liquid Take 5 mLs by mouth every 4 (four) hours as needed for cough.    [provider]  ?LORazepam (ATIVAN) 0.5 MG tablet Take 0.5 mg by mouth at bedtime.    [provider]  ?meloxicam (MOBIC) 7.5 MG  tablet Take 1 tablet (7.5 mg total) by mouth daily. 03/26/13   Schinlever, Barnetta Chapel, PA-C  ?omeprazole (PRILOSEC) 20 MG capsule Take 20 mg by mouth at bedtime.    [provider]  ?risperiDONE (RISPERDAL) 0.5 MG tablet Take 0.5 mg by mouth 2 (two) times daily.    [provider]  ?risperiDONE (RISPERDAL) 2 MG tablet Take 2 mg by mouth at bedtime.    [provider]  ?   ? ?Allergies    ?Penicillins   ? ?Review of Systems   ?Review of Systems  ?Musculoskeletal:  Positive for neck pain.  ?All other systems reviewed and are negative. ? ?Physical Exam ?Updated Vital Signs ?BP (!) 165/92   Pulse 80   Temp 98.3 ?F (36.8 ?C) (Oral)   Resp 16   Wt 47.2 kg   SpO2 100%  ?Physical Exam ?Vitals and nursing note reviewed.  ?Constitutional:   ?   General: She is not in acute distress. ?   Appearance: Normal appearance.  ?HENT:  ?   Head: Normocephalic and atraumatic.  ?   Comments: Some minimal tenderness to palpation of the right temporal head with no discrete hematoma, abrasion, laceration.  No step-off or deformity noted. ?Eyes:  ?   General:     ?   Right eye: No discharge.     ?   Left eye: No discharge.  ?  Neck:  ?   Comments: No midline cervical spine tenderness, intact range of motion of C-spine. ?Cardiovascular:  ?   Rate and Rhythm: Normal rate and regular rhythm.  ?   Heart sounds: No murmur heard. ?  No friction rub. No gallop.  ?Pulmonary:  ?   Effort: Pulmonary effort is normal.  ?   Breath sounds: Normal breath sounds.  ?Abdominal:  ?   General: Bowel sounds are normal.  ?   Palpations: Abdomen is soft.  ?Skin: ?   General: Skin is warm and dry.  ?   Capillary Refill: Capillary refill takes less than 2 seconds.  ?Neurological:  ?   Mental Status: She is alert and oriented to person, place, and time.  ?   Comments: Cranial nerves II through XII grossly intact.  Intact finger-nose, intact heel-to-shin.  Romberg negative, gait normal.  Alert and oriented x3.  Moves all 4 limbs  spontaneously, normal coordination.  No pronator drift.  Intact strength 5 out of 5 bilateral upper and lower extremities. ? ?  ?Psychiatric:     ?   Mood and Affect: Mood normal.     ?   Behavior: Behavior normal.  ? ? ?ED Results / Procedures / Treatments   ?Labs ?(all labs ordered are listed, but only abnormal results are displayed) ?Labs Reviewed - No data to display ? ?EKG ?None ? ?Radiology ?No results found. ? ?Procedures ?Procedures  ? ? ?Medications Ordered in ED ?Medications - No data to display ? ?ED Course/ Medical Decision Making/ A&P ?  ?                        ?Medical Decision Making ? ?I discussed this case with my attending physician who cosigned this note including patient's presenting symptoms, physical exam, and planned diagnostics and interventions. Attending physician stated agreement with plan or made changes to plan which were implemented.  ? ?Attending physician assessed patient at bedside. ? ?This patient presents to the ED for concern of fall from bed with concern for head, neck pain, this involves an extensive number of treatment options, and is a complaint that carries with it a high risk of complications and morbidity. The emergent differential diagnosis prior to evaluation includes, but is not limited to, cervical spine fracture, intracranial abnormality, hemorrhage, concussion, other fractures or dislocations.  ? ?This is not an exhaustive differential.  ? ?Past Medical History / Co-morbidities / Social History: ?chizoaffective disorder, bipolar, acid reflux ? ?Additional history: ?Additional history obtained from EMS. External records from outside source obtained and reviewed including previous ED visits. ? ?Physical Exam: ?Physical exam performed. The pertinent findings include: Patient with no focal neurologic deficits, intact Romberg, gait normal.  She denies any pain at time my exam other than very minimal head tenderness to palpation.  No pain at rest.  No dizziness, confusion  noted. ?  ?Cardiac Monitoring:  ?The patient was maintained on a cardiac monitor.  My attending physician Dr. Alvino Chapel viewed and interpreted the cardiac monitored which showed an underlying rhythm of: Normal sinus rhythm some mild ST elevation noted in anterior leads which is similar compared to previous EKG 8 years ago.  With no signs or symptoms of chest pain, shortness of breath at this time. ?  ?Disposition: ?After consideration of the diagnostic results and the patients response to treatment, I feel that patient appears stable for discharge back to her facility.  Performed shared decision making with patient, without  severe pain on exam or any pain at all, and no neurologic deficits do not believe that she would benefit from advanced radiographic imaging of the head, neck at this time.  Discussed she can use ibuprofen, Tylenol for pain.  She does not take any blood thinners so I have minimal clinical concern for intracranial bleed.  Patient presented several hours after the fall, and reports that she feels overall better at this time.  Discussed this case with my attending physician who agrees to the above plan. patient discharged in stable condition at this time..  ? ?Final Clinical Impression(s) / ED Diagnoses ?Final diagnoses:  ?Fall, initial encounter  ? ? ?Rx / DC Orders ?ED Discharge Orders   ? ? None  ? ?  ? ? ?  ?Anselmo Pickler, PA-C ?08/25/21 2221 ? ?  ?Davonna Belling, MD ?08/25/21 2325 ? ?

## 2021-08-25 NOTE — ED Triage Notes (Signed)
Pt by ems from [facility] where she had an unwitnessed fall.  Pt is A&O x4 and was ambulatory for ems.  No LOC.  Pt is reporting back pain and has a "knot on the back of her head".  Pt is able to report that she tripped and fell back over some furniture and struck her head.  VSS.  ?

## 2023-07-25 ENCOUNTER — Emergency Department (HOSPITAL_COMMUNITY)
Admission: EM | Admit: 2023-07-25 | Discharge: 2023-07-25 | Disposition: A | Discharge status: Home / Self Care | Payer: Medicare Other | Attending: Emergency Medicine | Admitting: Emergency Medicine

## 2023-07-25 ENCOUNTER — Encounter (HOSPITAL_COMMUNITY): Payer: Self-pay

## 2023-07-25 ENCOUNTER — Other Ambulatory Visit: Payer: Self-pay

## 2023-07-25 DIAGNOSIS — Z23 Encounter for immunization: Secondary | ICD-10-CM | POA: Diagnosis not present

## 2023-07-25 DIAGNOSIS — Z7982 Long term (current) use of aspirin: Secondary | ICD-10-CM | POA: Diagnosis not present

## 2023-07-25 DIAGNOSIS — S81811A Laceration without foreign body, right lower leg, initial encounter: Secondary | ICD-10-CM | POA: Diagnosis not present

## 2023-07-25 DIAGNOSIS — W19XXXA Unspecified fall, initial encounter: Secondary | ICD-10-CM | POA: Insufficient documentation

## 2023-07-25 DIAGNOSIS — S8991XA Unspecified injury of right lower leg, initial encounter: Secondary | ICD-10-CM | POA: Diagnosis present

## 2023-07-25 MED ORDER — LIDOCAINE-EPINEPHRINE (PF) 2 %-1:200000 IJ SOLN
10.0000 mL | Freq: Once | INTRAMUSCULAR | Status: AC
  Administered 2023-07-25: 10 mL
  Filled 2023-07-25: qty 20

## 2023-07-25 MED ORDER — TETANUS-DIPHTH-ACELL PERTUSSIS 5-2.5-18.5 LF-MCG/0.5 IM SUSY
0.5000 mL | PREFILLED_SYRINGE | Freq: Once | INTRAMUSCULAR | Status: AC
Start: 2023-07-25 — End: 2023-07-25
  Administered 2023-07-25: 0.5 mL via INTRAMUSCULAR
  Filled 2023-07-25: qty 0.5

## 2023-07-25 NOTE — ED Provider Notes (Signed)
.  Laceration Repair  Date/Time: 07/25/2023 12:53 PM  Performed by: Gailen Shelter, PA Authorized by: Gailen Shelter, PA   Consent:    Consent obtained:  Verbal   Consent given by:  Parent and patient   Risks discussed:  Infection, pain, poor cosmetic result and poor wound healing Universal protocol:    Procedure explained and questions answered to patient or proxy's satisfaction: yes     Relevant documents present and verified: yes     Test results available: yes     Imaging studies available: yes     Required blood products, implants, devices, and special equipment available: yes     Site/side marked: yes     Immediately prior to procedure, a time out was called: yes     Patient identity confirmed:  Verbally with patient and arm band Laceration details:    Length (cm):  5 Exploration:    Hemostasis achieved with:  Epinephrine   Imaging outcome: foreign body not noted     Wound extent: areolar tissue violated     Wound extent: fascia not violated, no foreign body and no signs of injury     Contaminated: no   Treatment:    Amount of cleaning:  Standard   Irrigation solution:  Sterile saline   Irrigation volume:  Copious   Visualized foreign bodies/material removed: no   Skin repair:    Repair method:  Sutures   Suture size:  5-0   Suture material:  Nylon   Suture technique:  Simple interrupted   Number of sutures:  7 Approximation:    Approximation:  Close Repair type:    Repair type:  Intermediate Post-procedure details:    Dressing:  Antibiotic ointment   Procedure completion:  Shawnie Dapper, PA 07/25/23 1254    Lorre Nick, MD 07/25/23 1303

## 2023-07-25 NOTE — ED Triage Notes (Signed)
Pt bib [facility] from [facility] and had a mechanical. Hit R lower leg on dresser. Bleeding is controlled but staff said it will probably need to be cleaned out, may need stitches, and they are unsure of last tetanus. Vitals WDL. But HR was a little irregular.

## 2023-07-25 NOTE — ED Notes (Signed)
 [facility] called for transport.

## 2023-07-25 NOTE — ED Provider Notes (Signed)
[facility] EMERGENCY DEPARTMENT AT [facility] Provider Note   CSN: [id number] Arrival date & time: 07/25/23  1044     History  Chief Complaint  Patient presents with   Fall   Leg Injury    April Lopez is a 78 y.o. female.  78 year old female presents with injury to right lower extremity.  Patient states that she injured it on a piece of furniture where she lives.  Bleeding was addressed with some Steri-Strips.  Her tetanus status is unknown.  Denies any other injuries.  No distal numbness or tingling to her right foot       Home Medications Prior to Admission medications   Medication Sig Start Date End Date Taking? Authorizing Provider  aspirin 500 MG EC tablet Take 1,000 mg by mouth every 6 (six) hours as needed for pain.    [provider]  benzonatate (TESSALON) 200 MG capsule Take 1 capsule (200 mg total) by mouth 3 (three) times daily as needed for cough. 08/21/13   Marisa Severin, MD  benztropine (COGENTIN) 0.5 MG tablet Take 0.5 mg by mouth 2 (two) times daily.    [provider]  dextromethorphan-guaiFENesin (MUCINEX DM) 30-600 MG per 12 hr tablet Take 1 tablet by mouth 2 (two) times daily as needed for cough (congestion). 08/21/13   Marisa Severin, MD  dextromethorphan-guaiFENesin (ROBITUSSIN-DM) 10-100 MG/5ML liquid Take 5 mLs by mouth every 4 (four) hours as needed for cough.    [provider]  LORazepam (ATIVAN) 0.5 MG tablet Take 0.5 mg by mouth at bedtime.    [provider]  meloxicam (MOBIC) 7.5 MG tablet Take 1 tablet (7.5 mg total) by mouth daily. 03/26/13   Schinlever, Santina Evans, PA-C  omeprazole (PRILOSEC) 20 MG capsule Take 20 mg by mouth at bedtime.    [provider]  risperiDONE (RISPERDAL) 0.5 MG tablet Take 0.5 mg by mouth 2 (two) times daily.    [provider]  risperiDONE (RISPERDAL) 2 MG tablet Take 2 mg by mouth at bedtime.    [provider]      Allergies    Penicillins     Review of Systems   Review of Systems  All other systems reviewed and are negative.   Physical Exam Updated Vital Signs BP 100/65 (BP Location: Right Arm)   Pulse 78   Temp (!) 97.4 F (36.3 C) (Oral)   Resp 18   Ht 1.575 m (5\' 2" )   Wt 45.4 kg   SpO2 96%   BMI 18.29 kg/m  Physical Exam Vitals and nursing note reviewed.  Constitutional:      General: She is not in acute distress.    Appearance: Normal appearance. She is well-developed. She is not toxic-appearing.  HENT:     Head: Normocephalic and atraumatic.  Eyes:     General: Lids are normal.     Conjunctiva/sclera: Conjunctivae normal.     Pupils: Pupils are equal, round, and reactive to light.  Neck:     Thyroid: No thyroid mass.     Trachea: No tracheal deviation.  Cardiovascular:     Rate and Rhythm: Normal rate and regular rhythm.     Heart sounds: Normal heart sounds. No murmur heard.    No gallop.  Pulmonary:     Effort: Pulmonary effort is normal. No respiratory distress.     Breath sounds: Normal breath sounds. No stridor. No decreased breath sounds, wheezing, rhonchi or rales.  Abdominal:     General:  There is no distension.     Palpations: Abdomen is soft.     Tenderness: There is no abdominal tenderness. There is no rebound.  Musculoskeletal:        General: No tenderness. Normal range of motion.     Cervical back: Normal range of motion and neck supple.       Legs:  Skin:    General: Skin is warm and dry.     Findings: No abrasion or rash.  Neurological:     Mental Status: She is alert and oriented to person, place, and time. Mental status is at baseline.     GCS: GCS eye subscore is 4. GCS verbal subscore is 5. GCS motor subscore is 6.     Cranial Nerves: No cranial nerve deficit.     Sensory: No sensory deficit.     Motor: Motor function is intact.  Psychiatric:        Attention and Perception: Attention normal.        Speech: Speech normal.        Behavior: Behavior normal.     ED  Results / Procedures / Treatments   Labs (all labs ordered are listed, but only abnormal results are displayed) Labs Reviewed - No data to display  EKG None  Radiology No results found.  Procedures Procedures    Medications Ordered in ED Medications - No data to display  ED Course/ Medical Decision Making/ A&P                                 Medical Decision Making Risk Prescription drug management.   Patient's tetanus status was updated here.  Wound was repaired by physician assistant.  Will discharge home        Final Clinical Impression(s) / ED Diagnoses Final diagnoses:  None    Rx / DC Orders ED Discharge Orders     None         Lorre Nick, MD 07/25/23 1302

## 2023-07-25 NOTE — Discharge Instructions (Signed)
Sutures out in 7 to 10 days 

## 2023-08-15 ENCOUNTER — Other Ambulatory Visit: Payer: Self-pay

## 2023-08-15 ENCOUNTER — Emergency Department (HOSPITAL_COMMUNITY)
Admission: EM | Admit: 2023-08-15 | Discharge: 2023-08-15 | Disposition: A | Discharge status: Home / Self Care | Attending: Emergency Medicine | Admitting: Emergency Medicine

## 2023-08-15 ENCOUNTER — Emergency Department (HOSPITAL_COMMUNITY)

## 2023-08-15 DIAGNOSIS — D696 Thrombocytopenia, unspecified: Secondary | ICD-10-CM | POA: Diagnosis not present

## 2023-08-15 DIAGNOSIS — Z7982 Long term (current) use of aspirin: Secondary | ICD-10-CM | POA: Diagnosis not present

## 2023-08-15 DIAGNOSIS — J069 Acute upper respiratory infection, unspecified: Secondary | ICD-10-CM | POA: Diagnosis present

## 2023-08-15 DIAGNOSIS — J101 Influenza due to other identified influenza virus with other respiratory manifestations: Secondary | ICD-10-CM | POA: Insufficient documentation

## 2023-08-15 DIAGNOSIS — Z79899 Other long term (current) drug therapy: Secondary | ICD-10-CM | POA: Insufficient documentation

## 2023-08-15 LAB — COMPREHENSIVE METABOLIC PANEL
ALT: 20 U/L (ref 0–44)
AST: 19 U/L (ref 15–41)
Albumin: 3.6 g/dL (ref 3.5–5.0)
Alkaline Phosphatase: 47 U/L (ref 38–126)
Anion gap: 8 (ref 5–15)
BUN: 13 mg/dL (ref 8–23)
CO2: 23 mmol/L (ref 22–32)
Calcium: 8.9 mg/dL (ref 8.9–10.3)
Chloride: 105 mmol/L (ref 98–111)
Creatinine, Ser: 0.58 mg/dL (ref 0.44–1.00)
GFR, Estimated: >60 mL/min (ref >60)
Glucose, Bld: 95 mg/dL (ref 70–99)
Potassium: 3.8 mmol/L (ref 3.5–5.1)
Sodium: 136 mmol/L (ref 135–145)
Total Bilirubin: 1 mg/dL (ref 0.0–1.2)
Total Protein: 6.4 g/dL — ABNORMAL LOW (ref 6.5–8.1)

## 2023-08-15 LAB — CBC WITH DIFFERENTIAL/PLATELET
Abs Immature Granulocytes: 0.03 10*3/uL (ref 0.00–0.07)
Basophils Absolute: 0 10*3/uL (ref 0.0–0.1)
Basophils Relative: 0 %
Eosinophils Absolute: 0.1 10*3/uL (ref 0.0–0.5)
Eosinophils Relative: 1 %
HCT: 38.7 % (ref 36.0–46.0)
Hemoglobin: 12.9 g/dL (ref 12.0–15.0)
Immature Granulocytes: 1 %
Lymphocytes Relative: 10 %
Lymphs Abs: 0.5 10*3/uL — ABNORMAL LOW (ref 0.7–4.0)
MCH: 31.7 pg (ref 26.0–34.0)
MCHC: 33.3 g/dL (ref 30.0–36.0)
MCV: 95.1 fL (ref 80.0–100.0)
Monocytes Absolute: 0.7 10*3/uL (ref 0.1–1.0)
Monocytes Relative: 15 %
Neutro Abs: 3.5 10*3/uL (ref 1.7–7.7)
Neutrophils Relative %: 73 %
Platelets: 127 10*3/uL — ABNORMAL LOW (ref 150–400)
RBC: 4.07 MIL/uL (ref 3.87–5.11)
RDW: 14.2 % (ref 11.5–15.5)
WBC: 4.8 10*3/uL (ref 4.0–10.5)
nRBC: 0 % (ref 0.0–0.2)

## 2023-08-15 LAB — URINALYSIS, ROUTINE W REFLEX MICROSCOPIC
Bilirubin Urine: NEGATIVE
Glucose, UA: NEGATIVE mg/dL
Hgb urine dipstick: NEGATIVE
Ketones, ur: NEGATIVE mg/dL
Leukocytes,Ua: NEGATIVE
Nitrite: NEGATIVE
Protein, ur: NEGATIVE mg/dL
Specific Gravity, Urine: 1.011 (ref 1.005–1.030)
pH: 7 (ref 5.0–8.0)

## 2023-08-15 LAB — RESP PANEL BY RT-PCR (RSV, FLU A&B, COVID)  RVPGX2
Influenza A by PCR: POSITIVE — AB
Influenza B by PCR: NEGATIVE
Resp Syncytial Virus by PCR: NEGATIVE
SARS Coronavirus 2 by RT PCR: NEGATIVE

## 2023-08-15 NOTE — ED Notes (Signed)
 [facility] called for transportation back to [facility]

## 2023-08-15 NOTE — ED Provider Notes (Signed)
 [place] EMERGENCY DEPARTMENT AT [facility] Provider Note   CSN: [id number] Arrival date & time: 08/15/23  1117     History  Chief Complaint  Patient presents with   URI    April Lopez is a 78 y.o. female.  Patient is a 78 year old female with a past medical history of schizophrenia presenting to the emergency department from [facility] [place] with cough.  Patient states that she has had a cough for the last few days.  Facility also reported concerns of weakness but she denies feeling weak to myself.  Facility reports that she has had flulike illness for the last 2 days.  She denies any fever, congestion or sore throat.  She denies chest pain or shortness of breath.  She denies any nausea or diarrhea, dysuria or hematuria.  The history is provided by the patient and the EMS personnel.  URI      Home Medications Prior to Admission medications   Medication Sig Start Date End Date Taking? Authorizing Provider  aspirin 500 MG EC tablet Take 1,000 mg by mouth every 6 (six) hours as needed for pain.    [provider]  benzonatate (TESSALON) 200 MG capsule Take 1 capsule (200 mg total) by mouth 3 (three) times daily as needed for cough. 08/21/13   Marisa Severin, MD  benztropine (COGENTIN) 0.5 MG tablet Take 0.5 mg by mouth 2 (two) times daily.    [provider]  dextromethorphan-guaiFENesin (MUCINEX DM) 30-600 MG per 12 hr tablet Take 1 tablet by mouth 2 (two) times daily as needed for cough (congestion). 08/21/13   Marisa Severin, MD  dextromethorphan-guaiFENesin (ROBITUSSIN-DM) 10-100 MG/5ML liquid Take 5 mLs by mouth every 4 (four) hours as needed for cough.    [provider]  LORazepam (ATIVAN) 0.5 MG tablet Take 0.5 mg by mouth at bedtime.    [provider]  meloxicam (MOBIC) 7.5 MG tablet Take 1 tablet (7.5 mg total) by mouth daily. 03/26/13   Schinlever, Santina Evans, PA-C  omeprazole (PRILOSEC) 20 MG capsule Take 20 mg by  mouth at bedtime.    [provider]  risperiDONE (RISPERDAL) 0.5 MG tablet Take 0.5 mg by mouth 2 (two) times daily.    [provider]  risperiDONE (RISPERDAL) 2 MG tablet Take 2 mg by mouth at bedtime.    [provider]      Allergies    Penicillins    Review of Systems   Review of Systems  Physical Exam Updated Vital Signs BP 110/66 (BP Location: Left Arm)   Pulse 73   Temp 99.7 F (37.6 C) (Oral)   Resp 16   Ht 5' (1.524 m)   Wt 45.4 kg   SpO2 95%   BMI 19.53 kg/m  Physical Exam Vitals and nursing note reviewed.  Constitutional:      General: She is not in acute distress.    Appearance: Normal appearance.  HENT:     Head: Normocephalic and atraumatic.     Nose: Nose normal.     Mouth/Throat:     Mouth: Mucous membranes are moist.     Pharynx: Oropharynx is clear. No oropharyngeal exudate or posterior oropharyngeal erythema.  Eyes:     Extraocular Movements: Extraocular movements intact.     Conjunctiva/sclera: Conjunctivae normal.  Cardiovascular:     Rate and Rhythm: Normal rate and regular rhythm.     Heart sounds: Normal heart sounds.  Pulmonary:     Effort: Pulmonary effort  is normal.     Breath sounds: Normal breath sounds.  Abdominal:     General: Abdomen is flat.     Palpations: Abdomen is soft.     Tenderness: There is no abdominal tenderness.  Musculoskeletal:        General: Normal range of motion.     Cervical back: Normal range of motion.  Skin:    General: Skin is warm and dry.  Neurological:     General: No focal deficit present.     Mental Status: She is alert and oriented to person, place, and time.     Sensory: No sensory deficit.     Motor: No weakness.  Psychiatric:        Mood and Affect: Mood normal.        Behavior: Behavior normal.     ED Results / Procedures / Treatments   Labs (all labs ordered are listed, but only abnormal results are displayed) Labs Reviewed  RESP PANEL BY RT-PCR (RSV, FLU  A&B, COVID)  RVPGX2 - Abnormal; Notable for the following components:      Result Value   Influenza A by PCR POSITIVE (*)    All other components within normal limits  COMPREHENSIVE METABOLIC PANEL - Abnormal; Notable for the following components:   Total Protein 6.4 (*)    All other components within normal limits  CBC WITH DIFFERENTIAL/PLATELET - Abnormal; Notable for the following components:   Platelets 127 (*)    Lymphs Abs 0.5 (*)    All other components within normal limits  URINALYSIS, ROUTINE W REFLEX MICROSCOPIC    EKG None  Radiology DG Chest 2 View Result Date: 08/15/2023 CLINICAL DATA:  Cough. EXAM: CHEST - 2 VIEW COMPARISON:  Chest radiograph dated 08/21/2013. FINDINGS: No focal consolidation, pleural effusion, or pneumothorax. The cardiac silhouette is within limits. Atherosclerotic calcification of the aortic arch. No acute osseous pathology. IMPRESSION: No active cardiopulmonary disease. Electronically Signed   By: Elgie Collard M.D.   On: 08/15/2023 14:19    Procedures Procedures    Medications Ordered in ED Medications - No data to display  ED Course/ Medical Decision Making/ A&P Clinical Course as of 08/15/23 1454  Wed Aug 15, 2023  1314 Flu A positive, mild thrombocytopenia at baseline. [VK]  1315 EKG shows normal sinus rhythm without acute ischemic changes. [VK]  1431 No acute disease on CXR. With symptoms ongoing for at least 2 days and no signficant risk factors would not recommend tamiflu. Patient is stable for dsicharge back to facility for symptomatic management. [VK]    Clinical Course User Index [VK] [name], DO                                 Medical Decision Making This patient presents to the ED with chief complaint(s) of cough, weakness with pertinent past medical history of schizophrenia which further complicates the presenting complaint. The complaint involves an extensive differential diagnosis and also carries with it a  high risk of complications and morbidity.    The differential diagnosis includes viral syndrome, pneumonia, pneumothorax, pulmonary edema, pleural effusion, arrhythmia, anemia, dehydration, electrolyte abnormality, no focal neurologic deficits making CVA unlikely  Additional history obtained: Additional history obtained from EMS  Records reviewed N/A  ED Course and Reassessment: On patient's arrival she is hemodynamically stable in no acute distress.  Had viral swab drawn in triage.  Patient will additionally have labs,  EKG and chest x-ray to evaluate for cause of her weakness and her cough and will be closely reassessed.  Independent labs interpretation:  The following labs were independently interpreted: Flu A positive, otherwise within normal range  Independent visualization of imaging: - I independently visualized the following imaging with scope of interpretation limited to determining acute life threatening conditions related to emergency care: CXR, which revealed no acute disease  Consultation: - Consulted or discussed management/test interpretation w/ external professional: N/A  Consideration for admission or further workup: Patient has no emergent conditions requiring admission or further work-up at this time and is stable for discharge home with primary care follow-up  Social Determinants of health: N/A    Amount and/or Complexity of Data Reviewed Labs: ordered. Radiology: ordered.          Final Clinical Impression(s) / ED Diagnoses Final diagnoses:  Influenza A    Rx / DC Orders ED Discharge Orders     None         [name], DO 08/15/23 1455

## 2023-08-15 NOTE — ED Notes (Addendum)
 RN called [facility] [place] and s/w [employer] and gave report.

## 2023-08-15 NOTE — ED Notes (Signed)
[facility] arrived to transport patient. 

## 2023-08-15 NOTE — ED Triage Notes (Signed)
 Patient to ED by EMS from [facility] [place] for flu like symptoms. Patient denies pain, cough, N/V. EMS states facility reported weakness x2-3 days.

## 2023-08-15 NOTE — Discharge Instructions (Signed)
 You were seen in the emergency department for your cough and congestion.  You tested positive for Flu A.  You did not require treatment with any antiviral medications can continue symptomatic treatment with Tylenol and Motrin as needed for fevers and bodyaches and both can be taken up to every 6 hours.  You can try Mucinex or raw honey as needed for your cough and you can use a humidifier or hot steam from the shower to help with your congestion as well as over-the-counter and nasal decongestant sprays.  You can follow-up with your primary doctor in the next few days to have your symptoms rechecked.  You should return to the emergency department for significantly worsening shortness of breath, severe chest pain, repetitive vomiting or if you have any other new or concerning symptoms.

## 2023-12-14 ENCOUNTER — Encounter (HOSPITAL_COMMUNITY): Payer: Self-pay

## 2023-12-14 ENCOUNTER — Other Ambulatory Visit: Payer: Self-pay

## 2023-12-14 ENCOUNTER — Emergency Department (HOSPITAL_COMMUNITY)
Admission: EM | Admit: 2023-12-14 | Discharge: 2023-12-14 | Disposition: A | Discharge status: Home / Self Care | Source: Skilled Nursing Facility | Attending: Emergency Medicine | Admitting: Emergency Medicine

## 2023-12-14 DIAGNOSIS — H5789 Other specified disorders of eye and adnexa: Secondary | ICD-10-CM | POA: Diagnosis present

## 2023-12-14 DIAGNOSIS — H109 Unspecified conjunctivitis: Secondary | ICD-10-CM

## 2023-12-14 DIAGNOSIS — B9689 Other specified bacterial agents as the cause of diseases classified elsewhere: Secondary | ICD-10-CM | POA: Insufficient documentation

## 2023-12-14 DIAGNOSIS — H1089 Other conjunctivitis: Secondary | ICD-10-CM | POA: Diagnosis not present

## 2023-12-14 MED ORDER — ERYTHROMYCIN 5 MG/GM OP OINT
TOPICAL_OINTMENT | Freq: Once | OPHTHALMIC | Status: DC
  Filled 2023-12-14: qty 3.5

## 2023-12-14 MED ORDER — ERYTHROMYCIN 5 MG/GM OP OINT: TOPICAL_OINTMENT | OPHTHALMIC | 0 refills | Status: AC

## 2023-12-14 MED ORDER — FLUORESCEIN SODIUM 1 MG OP STRP: 1.0000 | ORAL_STRIP | Freq: Once | OPHTHALMIC | Status: DC

## 2023-12-14 NOTE — ED Provider Notes (Signed)
 [place] EMERGENCY DEPARTMENT AT [facility] Provider Note   CSN: [id number] Arrival date & time: 12/14/23  1758     Patient presents with: Conjunctivitis   April Lopez is a 78 y.o. female here for evaluation of left eye drainage over the last week.  She denies any vision changes, eye pain, redness.  Typically wears glasses however states her roommate broke them so she has not been able to wear them.  Does not wear contacts.  Denies any pruritus, headache, fever, periorbital swelling.  No meds PTA.    Baseline mentation per EMS   HPI     Prior to Admission medications   Medication Sig Start Date End Date Taking? Authorizing Provider  erythromycin  ophthalmic ointment Place a 1/2 inch ribbon of ointment into the lower eyelid. 12/14/23  Yes Jamire Shabazz A, PA-C  aspirin 500 MG EC tablet Take 1,000 mg by mouth every 6 (six) hours as needed for pain.    [provider]  benzonatate  (TESSALON ) 200 MG capsule Take 1 capsule (200 mg total) by mouth 3 (three) times daily as needed for cough. 08/21/13   Saul Copping, MD  benztropine (COGENTIN) 0.5 MG tablet Take 0.5 mg by mouth 2 (two) times daily.    [provider]  dextromethorphan -guaiFENesin  (MUCINEX  DM) 30-600 MG per 12 hr tablet Take 1 tablet by mouth 2 (two) times daily as needed for cough (congestion). 08/21/13   Saul Copping, MD  dextromethorphan -guaiFENesin  (ROBITUSSIN-DM) 10-100 MG/5ML liquid Take 5 mLs by mouth every 4 (four) hours as needed for cough.    [provider]  LORazepam (ATIVAN) 0.5 MG tablet Take 0.5 mg by mouth at bedtime.    [provider]  meloxicam  (MOBIC ) 7.5 MG tablet Take 1 tablet (7.5 mg total) by mouth daily. 03/26/13   Schinlever, Dorothyann, PA-C  omeprazole (PRILOSEC) 20 MG capsule Take 20 mg by mouth at bedtime.    [provider]  risperiDONE (RISPERDAL) 0.5 MG tablet Take 0.5 mg by mouth 2 (two) times daily.    [provider]   risperiDONE (RISPERDAL) 2 MG tablet Take 2 mg by mouth at bedtime.    [provider]    Allergies: Penicillins    Review of Systems  Constitutional: Negative.   HENT: Negative.    Eyes:  Positive for discharge and redness. Negative for photophobia and visual disturbance.  Respiratory: Negative.    Cardiovascular: Negative.   Gastrointestinal: Negative.   Genitourinary: Negative.   Musculoskeletal: Negative.   Skin: Negative.   Neurological: Negative.   All other systems reviewed and are negative.   Updated Vital Signs BP 111/88   Pulse 71   Temp 98.3 F (36.8 C) (Oral)   Resp 16   Ht 5' (1.524 m)   Wt 45.4 kg   SpO2 97%   BMI 19.55 kg/m   Physical Exam Vitals and nursing note reviewed.  Constitutional:      General: She is not in acute distress.    Appearance: She is well-developed. She is not ill-appearing or diaphoretic.  HENT:     Head: Normocephalic and atraumatic.     Jaw: There is normal jaw occlusion.     Comments: No raccoon eyes, Battle sign, periorbital erythema, edema Eyes:     Extraocular Movements: Extraocular movements intact.     Conjunctiva/sclera: Conjunctivae normal.     Pupils: Pupils are equal, round, and reactive to light.     Comments: PERRLA, right conjunctiva clear. Left conjunctiva mild  erythema.  Yellow purulent discharge left medial canthus.  Matting Meibomian glands on lower lid. IOP 15 left.  No fluorescein  uptake left eye. No corneal ulcerations.  Cardiovascular:     Rate and Rhythm: Normal rate and regular rhythm.  Pulmonary:     Effort: Pulmonary effort is normal. No respiratory distress.  Abdominal:     General: There is no distension.     Palpations: Abdomen is soft.  Musculoskeletal:        General: Normal range of motion.     Cervical back: Normal range of motion and neck supple.     Comments: Moves all 4 extremities, no bony tenderness.  No midline back pain  Skin:    General: Skin is warm and dry.      Capillary Refill: Capillary refill takes less than 2 seconds.     Comments: No obvious edema, erythema, contusions, abrasions, lacerations  Neurological:     General: No focal deficit present.     Mental Status: She is alert and oriented to person, place, and time.     Cranial Nerves: Cranial nerves 2-12 are intact.     Sensory: Sensation is intact.     Motor: Motor function is intact.     Comments: Pleasant, answers questions appropriately, moves all 4 extremities    (all labs ordered are listed, but only abnormal results are displayed) Labs Reviewed - No data to display  EKG: None  Radiology: No results found.   Procedures   Medications Ordered in the ED  fluorescein  ophthalmic strip 1 strip (has no administration in time range)  erythromycin  ophthalmic ointment (has no administration in time range)     78 year old here for evaluation of left eye drainage.  Exam today consistent with bacterial conjunctivitis.  No fluorescein  uptake low suspicion for corneal abrasion, ulcerations.  IOP's within normal limits.  She has no periorbital edema, erythema, low suspicion for periorbital/orbital cellulitis.  She denies any recent injury or trauma.  She does not wear contacts.  No headache, vision changes, flashers, floaters--to suggest acute angle glaucoma, retinal detachment.  No recent falls or injuries.  She was started on antibiotic drops, first given here.   Will have her follow back up with PCP at her facility.  She will return for any worsening symptoms.  Do not feel she needs additional labs or imaging at this time.  The patient has been appropriately medically screened and/or stabilized in the ED. I have low suspicion for any other emergent medical condition which would require further screening, evaluation or treatment in the ED or require inpatient management.  Patient is hemodynamically stable and in no acute distress.  Patient able to ambulate in department prior to ED.   Evaluation does not show acute pathology that would require ongoing or additional emergent interventions while in the emergency department or further inpatient treatment.  I have discussed the diagnosis with the patient and answered all questions.  Pain is been managed while in the emergency department and patient has no further complaints prior to discharge.  Patient is comfortable with plan discussed in room and is stable for discharge at this time.  I have discussed strict return precautions for returning to the emergency department.  Patient was encouraged to follow-up with PCP/specialist refer to at discharge.  Medical Decision Making Amount and/or Complexity of Data Reviewed Independent Historian: EMS External Data Reviewed: notes.  Risk OTC drugs. Prescription drug management. Decision regarding hospitalization. Diagnosis or treatment significantly limited by social determinants of health.        Final diagnoses:  Bacterial conjunctivitis    ED Discharge Orders          Ordered    erythromycin  ophthalmic ointment        12/14/23 1858               Ileah Falkenstein A, PA-C 12/14/23 1900    Cottie Donnice PARAS, MD 12/14/23 2049

## 2023-12-14 NOTE — ED Notes (Signed)
 [facility] contacted for transport

## 2023-12-14 NOTE — Discharge Instructions (Addendum)
 Have started on drops for bacterial conjunctivitis  Use as prescribed  Follow-up outpatient, return for any worsening symptoms

## 2023-12-14 NOTE — ED Triage Notes (Signed)
 Pt arrived with EMS with reporting pink eye, left eye 1 week. Sent by facility. No other issues reported.
